# Patient Record
Sex: Female | Born: 1940 | Race: Black or African American | Hispanic: No | Marital: Married | Smoking: Never smoker
Health system: Southern US, Community
[De-identification: ages and names within clinical notes are randomized; demographics above are authoritative.]

---

## 2013-03-18 ENCOUNTER — Ambulatory Visit: Payer: Self-pay | Admitting: Oral Surgery

## 2013-05-07 ENCOUNTER — Emergency Department: Payer: Self-pay | Admitting: Emergency Medicine

## 2013-05-07 LAB — CBC
HGB: 11.4 g/dL — ABNORMAL LOW (ref 12.0–16.0)
MCHC: 31.8 g/dL — ABNORMAL LOW (ref 32.0–36.0)
MCV: 76 fL — ABNORMAL LOW (ref 80–100)
Platelet: 242 10*3/uL (ref 150–440)
RDW: 14.7 % — ABNORMAL HIGH (ref 11.5–14.5)
WBC: 6.6 10*3/uL (ref 3.6–11.0)

## 2013-05-07 LAB — BASIC METABOLIC PANEL
BUN: 24 mg/dL — ABNORMAL HIGH (ref 7–18)
Calcium, Total: 8.1 mg/dL — ABNORMAL LOW (ref 8.5–10.1)
Chloride: 98 mmol/L (ref 98–107)
EGFR (Non-African Amer.): 47 — ABNORMAL LOW
Osmolality: 278 (ref 275–301)
Potassium: 3.1 mmol/L — ABNORMAL LOW (ref 3.5–5.1)
Sodium: 137 mmol/L (ref 136–145)

## 2013-05-07 LAB — TROPONIN I: Troponin-I: <0.02 ng/mL

## 2013-07-22 ENCOUNTER — Encounter: Payer: Self-pay | Admitting: Internal Medicine

## 2013-07-27 LAB — HEMOGLOBIN A1C: Hemoglobin A1C: 6.9 % — ABNORMAL HIGH (ref 4.2–6.3)

## 2013-08-02 LAB — URINALYSIS, COMPLETE
BILIRUBIN, UR: NEGATIVE
Blood: NEGATIVE
Glucose,UR: NEGATIVE mg/dL (ref 0–75)
Hyaline Cast: 2
KETONE: NEGATIVE
NITRITE: NEGATIVE
Ph: 8 (ref 4.5–8.0)
Protein: NEGATIVE
SPECIFIC GRAVITY: 1.014 (ref 1.003–1.030)
Squamous Epithelial: NONE SEEN
WBC UR: 72 /HPF (ref 0–5)

## 2013-08-05 LAB — URINE CULTURE

## 2013-08-10 LAB — BASIC METABOLIC PANEL
ANION GAP: 5 — AB (ref 7–16)
BUN: 13 mg/dL (ref 7–18)
CHLORIDE: 109 mmol/L — AB (ref 98–107)
Calcium, Total: 8.5 mg/dL (ref 8.5–10.1)
Co2: 26 mmol/L (ref 21–32)
Creatinine: 0.88 mg/dL (ref 0.60–1.30)
EGFR (African American): >60
Glucose: 89 mg/dL (ref 65–99)
OSMOLALITY: 279 (ref 275–301)
Potassium: 3.7 mmol/L (ref 3.5–5.1)
Sodium: 140 mmol/L (ref 136–145)

## 2013-08-10 LAB — HEMOGLOBIN A1C: HEMOGLOBIN A1C: 6.9 % — AB (ref 4.2–6.3)

## 2013-08-22 ENCOUNTER — Emergency Department: Payer: Self-pay | Admitting: Emergency Medicine

## 2013-08-22 LAB — URINALYSIS, COMPLETE
Bilirubin,UR: NEGATIVE
Blood: NEGATIVE
GLUCOSE, UR: NEGATIVE mg/dL (ref 0–75)
Ketone: NEGATIVE
Leukocyte Esterase: NEGATIVE
Nitrite: NEGATIVE
PH: 7 (ref 4.5–8.0)
Protein: NEGATIVE
SPECIFIC GRAVITY: 1.01 (ref 1.003–1.030)
WBC UR: <1 /HPF (ref 0–5)

## 2013-08-22 LAB — COMPREHENSIVE METABOLIC PANEL
ALT: 21 U/L (ref 12–78)
Albumin: 2.8 g/dL — ABNORMAL LOW (ref 3.4–5.0)
Alkaline Phosphatase: 71 U/L
Anion Gap: 4 — ABNORMAL LOW (ref 7–16)
BILIRUBIN TOTAL: 0.3 mg/dL (ref 0.2–1.0)
BUN: 20 mg/dL — ABNORMAL HIGH (ref 7–18)
CALCIUM: 8.2 mg/dL — AB (ref 8.5–10.1)
CHLORIDE: 105 mmol/L (ref 98–107)
Co2: 31 mmol/L (ref 21–32)
Creatinine: 0.96 mg/dL (ref 0.60–1.30)
EGFR (Non-African Amer.): 60 — ABNORMAL LOW
GLUCOSE: 172 mg/dL — AB (ref 65–99)
OSMOLALITY: 286 (ref 275–301)
POTASSIUM: 4.6 mmol/L (ref 3.5–5.1)
SGOT(AST): 33 U/L (ref 15–37)
SODIUM: 140 mmol/L (ref 136–145)
Total Protein: 7.4 g/dL (ref 6.4–8.2)

## 2013-08-22 LAB — CBC
HCT: 33 % — ABNORMAL LOW (ref 35.0–47.0)
HGB: 10.4 g/dL — AB (ref 12.0–16.0)
MCH: 25.1 pg — ABNORMAL LOW (ref 26.0–34.0)
MCHC: 31.5 g/dL — AB (ref 32.0–36.0)
MCV: 80 fL (ref 80–100)
Platelet: 244 10*3/uL (ref 150–440)
RBC: 4.13 10*6/uL (ref 3.80–5.20)
RDW: 15.5 % — AB (ref 11.5–14.5)
WBC: 9 10*3/uL (ref 3.6–11.0)

## 2013-10-09 ENCOUNTER — Emergency Department: Payer: Self-pay | Admitting: Emergency Medicine

## 2014-02-11 ENCOUNTER — Ambulatory Visit: Payer: Self-pay | Admitting: Family Medicine

## 2015-03-31 IMAGING — CR DG ANKLE COMPLETE 3+V*L*
2 series · 4 of 4 positions shown · non-contrast
Comparison: None.

CLINICAL DATA: Status post fall.  Ankle pain.

EXAM:
LEFT ANKLE COMPLETE - 3+ VIEW

[kdxr ankle left complete (1 of 2)]
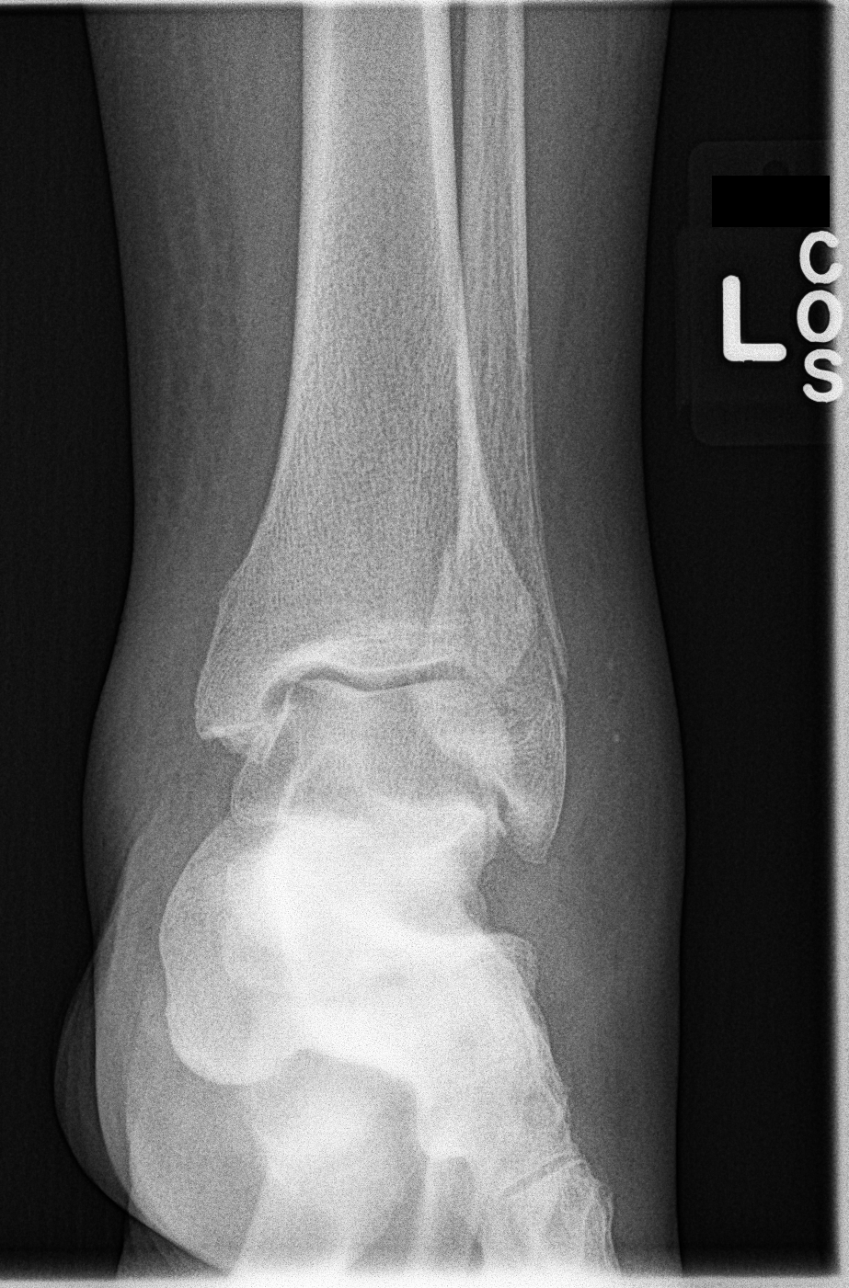

[Series 1: kdxr ankle left complete · 0.14mm/px · 3 of 3 slices shown (2 of 2)]
[im 1/3]
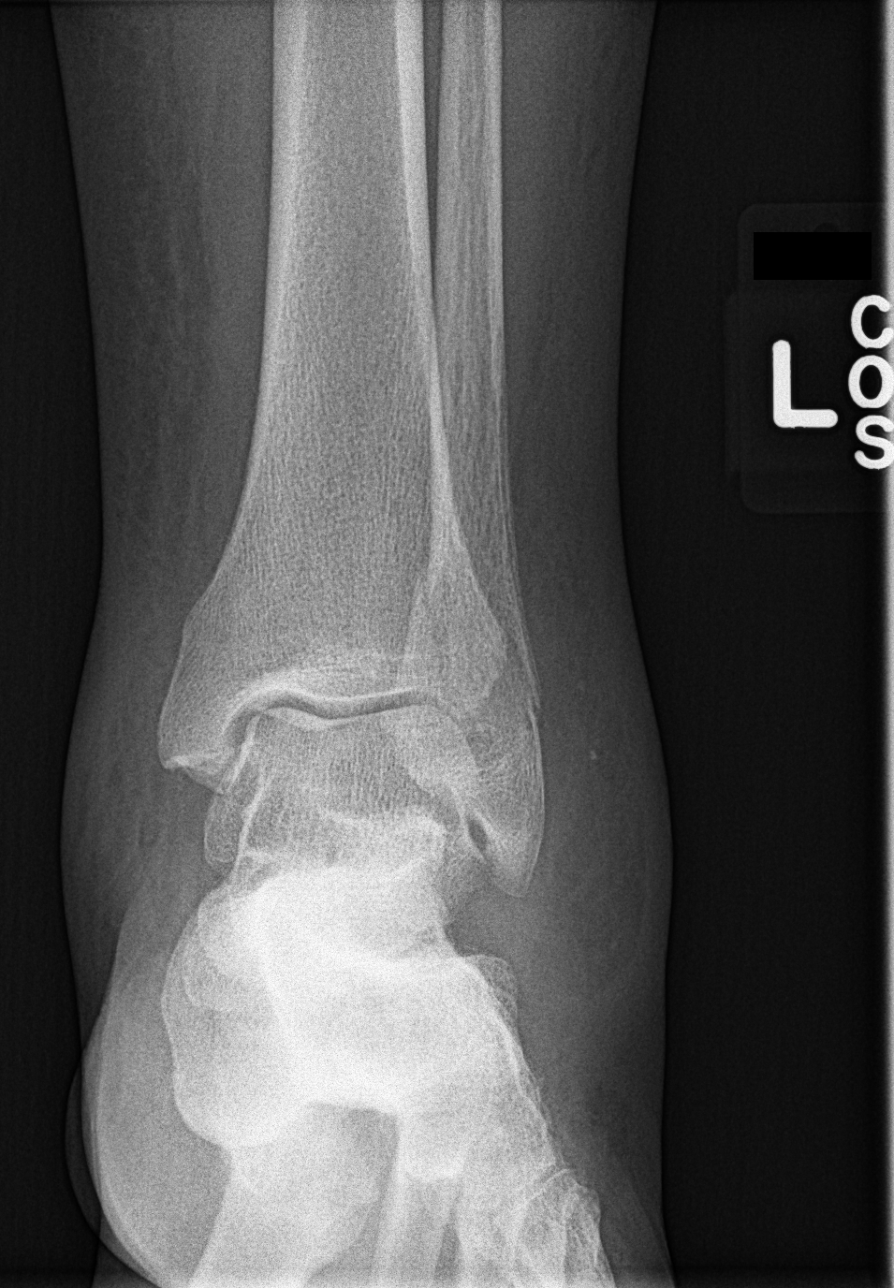
[im 2/3]
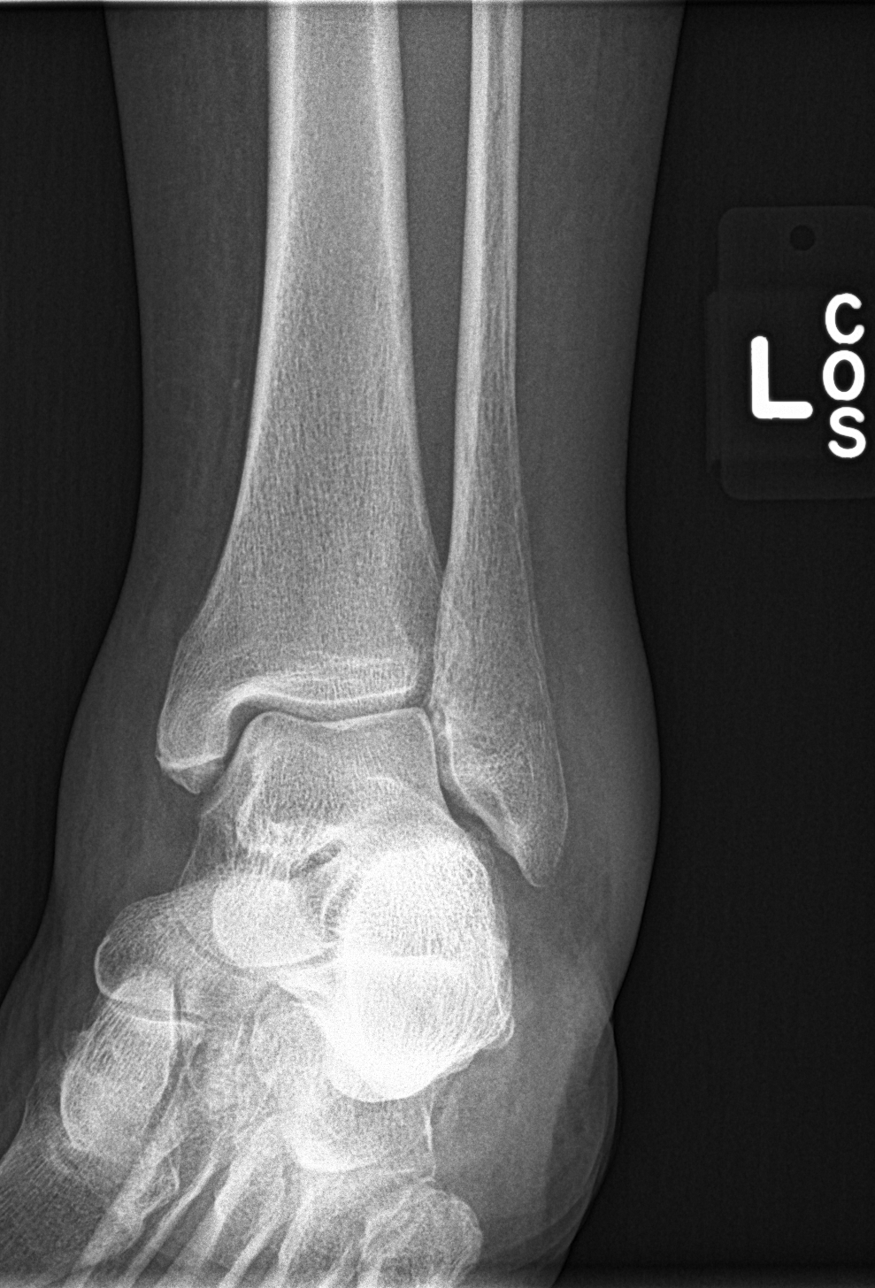
[im 3/3]
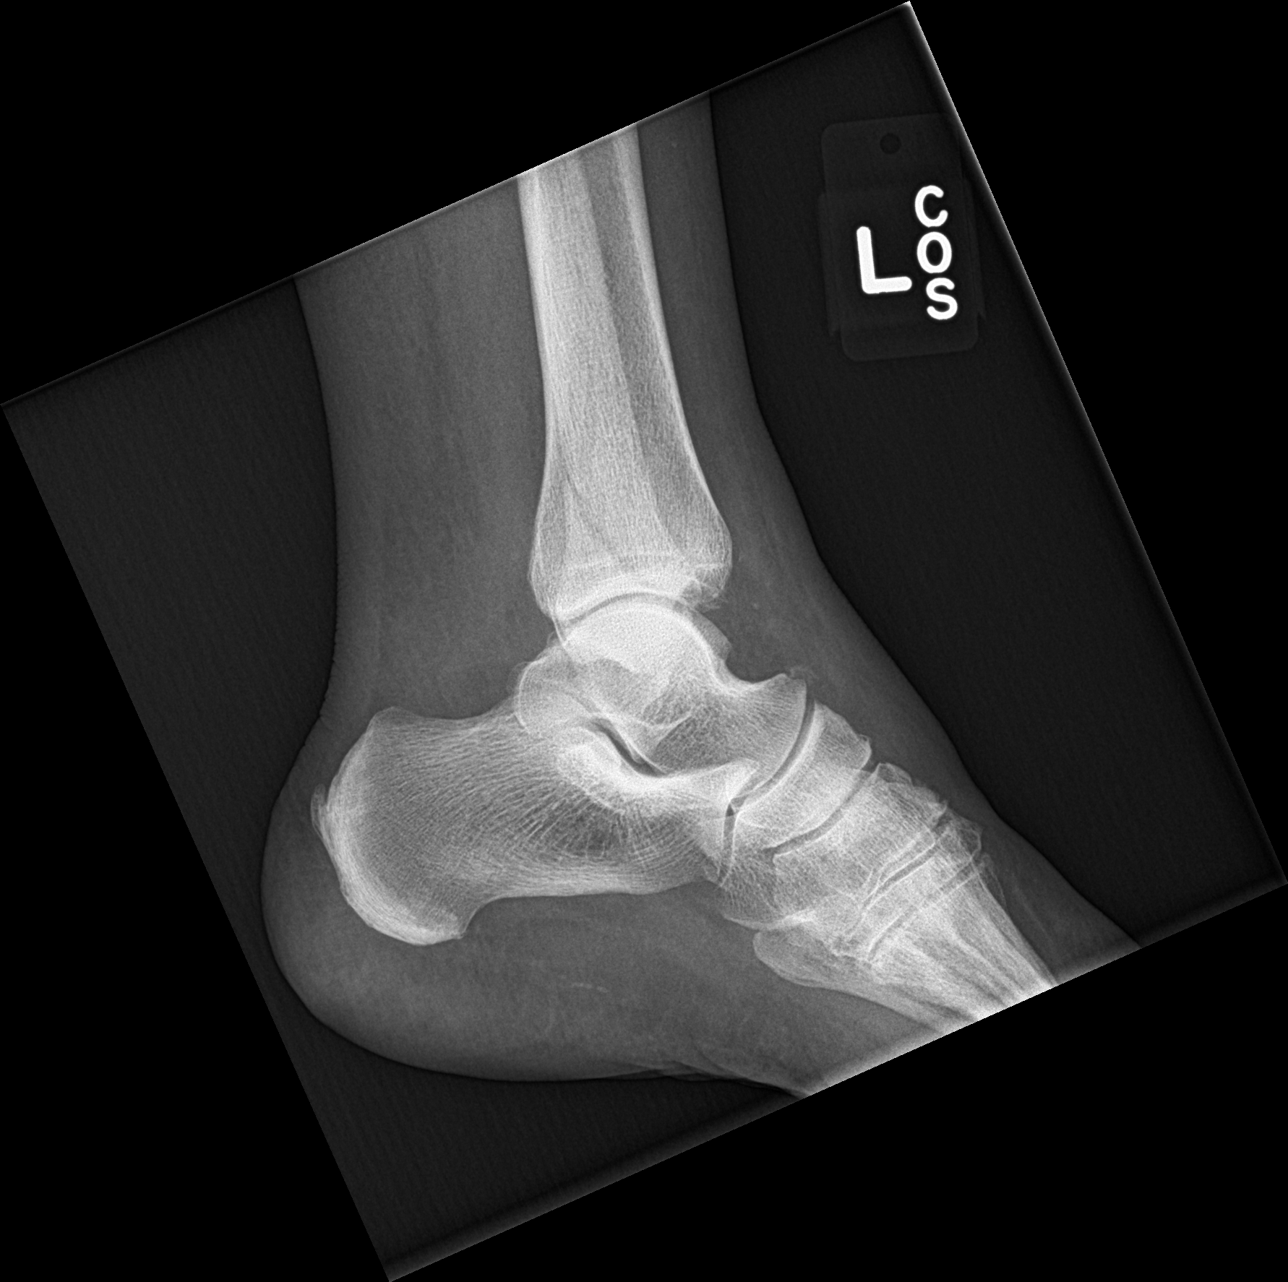

[4 of 4 positions shown; findings below may reference images not displayed]

FINDINGS: The patient has an oblique nondisplaced fracture of the distal
fibula. Soft tissue swelling is present about the lateral ankle. No
other acute bony abnormality is identified. Soft tissues about the
ankle for swollen.
IMPRESSION: Oblique, nondisplaced lateral malleolar fracture.

## 2015-07-24 ENCOUNTER — Other Ambulatory Visit: Payer: Self-pay | Admitting: Family Medicine

## 2015-08-22 ENCOUNTER — Encounter: Payer: Self-pay | Admitting: *Deleted

## 2015-08-22 DIAGNOSIS — R109 Unspecified abdominal pain: Secondary | ICD-10-CM | POA: Diagnosis not present

## 2015-08-22 DIAGNOSIS — Z5321 Procedure and treatment not carried out due to patient leaving prior to being seen by health care provider: Secondary | ICD-10-CM | POA: Diagnosis not present

## 2015-08-22 LAB — CBC
HCT: 36.6 % (ref 35.0–47.0)
Hemoglobin: 11.7 g/dL — ABNORMAL LOW (ref 12.0–16.0)
MCH: 24.4 pg — ABNORMAL LOW (ref 26.0–34.0)
MCHC: 32 g/dL (ref 32.0–36.0)
MCV: 76.2 fL — AB (ref 80.0–100.0)
PLATELETS: 222 10*3/uL (ref 150–440)
RBC: 4.8 MIL/uL (ref 3.80–5.20)
RDW: 15.8 % — AB (ref 11.5–14.5)
WBC: 6.2 10*3/uL (ref 3.6–11.0)

## 2015-08-22 LAB — BASIC METABOLIC PANEL
ANION GAP: 7 (ref 5–15)
BUN: 16 mg/dL (ref 6–20)
CALCIUM: 9.6 mg/dL (ref 8.9–10.3)
CO2: 34 mmol/L — AB (ref 22–32)
Chloride: 94 mmol/L — ABNORMAL LOW (ref 101–111)
Creatinine, Ser: 0.9 mg/dL (ref 0.44–1.00)
GFR calc Af Amer: >60 mL/min (ref >60)
GFR calc non Af Amer: >60 mL/min (ref >60)
GLUCOSE: 264 mg/dL — AB (ref 65–99)
Potassium: 3.1 mmol/L — ABNORMAL LOW (ref 3.5–5.1)
Sodium: 135 mmol/L (ref 135–145)

## 2015-08-22 LAB — URINALYSIS COMPLETE WITH MICROSCOPIC (ARMC ONLY)
Bilirubin Urine: NEGATIVE
GLUCOSE, UA: NEGATIVE mg/dL
HGB URINE DIPSTICK: NEGATIVE
Ketones, ur: NEGATIVE mg/dL
Nitrite: NEGATIVE
PH: 6 (ref 5.0–8.0)
Protein, ur: NEGATIVE mg/dL
Specific Gravity, Urine: 1.012 (ref 1.005–1.030)

## 2015-08-22 NOTE — ED Notes (Addendum)
Pt to triage via wheelchair.  Pt reports right side flank pain for 2 weeks.  Pain is intermittent.  Pt taking pain meds without relief.  No n/v/d.  No hx of kidney stones.  No known injury to back . No chest pain.  No sob.   Pt alert.

## 2015-08-23 ENCOUNTER — Emergency Department
Admission: EM | Admit: 2015-08-23 | Discharge: 2015-08-23 | Disposition: A | Discharge status: Home / Self Care | Payer: 59 | Attending: Emergency Medicine | Admitting: Emergency Medicine

## 2015-11-30 ENCOUNTER — Other Ambulatory Visit: Payer: Self-pay | Admitting: Family Medicine

## 2015-11-30 DIAGNOSIS — R600 Localized edema: Secondary | ICD-10-CM

## 2015-12-04 ENCOUNTER — Ambulatory Visit
Admission: RE | Admit: 2015-12-04 | Discharge: 2015-12-04 | Disposition: A | Discharge status: Home / Self Care | Payer: 59 | Source: Ambulatory Visit | Attending: Family Medicine | Admitting: Family Medicine

## 2015-12-04 ENCOUNTER — Ambulatory Visit: Admission: RE | Admit: 2015-12-04 | Payer: Medicare PPO | Source: Ambulatory Visit

## 2015-12-04 DIAGNOSIS — R600 Localized edema: Secondary | ICD-10-CM | POA: Insufficient documentation

## 2018-02-01 IMAGING — US US EXTREM LOW VENOUS BILAT
1 series · 14 of 24 positions shown · non-contrast
Comparison: None.

CLINICAL DATA: Bilateral lower extremity edema right greater than
left for 2 month

EXAM:
BILATERAL LOWER EXTREMITY VENOUS DUPLEX ULTRASOUND
TECHNIQUE: Doppler venous assessment of the left lower extremity deep venous
system was performed, including characterization of spectral flow,
compressibility, and phasicity.

[Series 1: us extrem low venous bilat · 0.08mm/px · 14 of 62 slices shown]
[im 1/62]
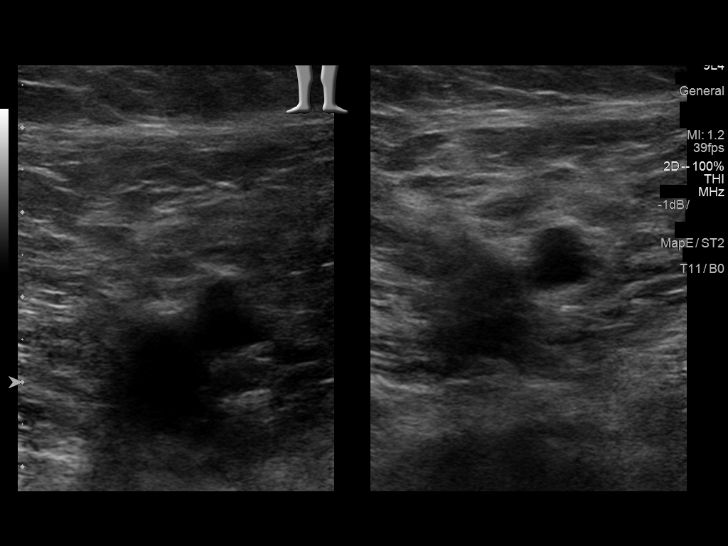
[im 6/62]
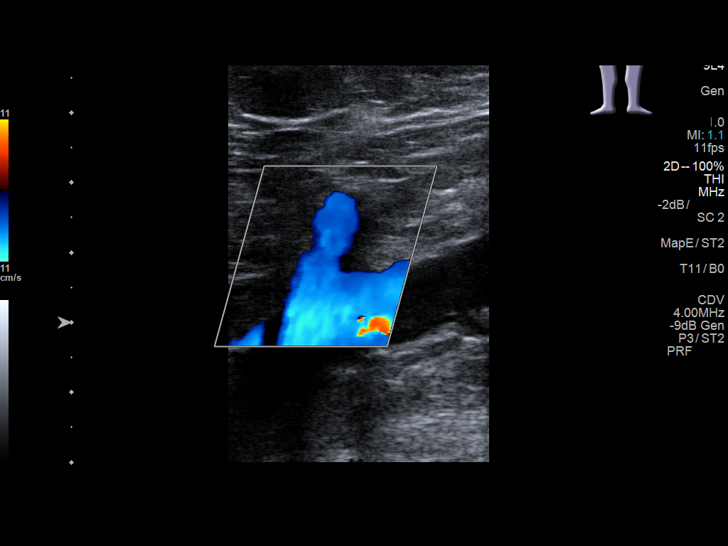
[im 11/62]
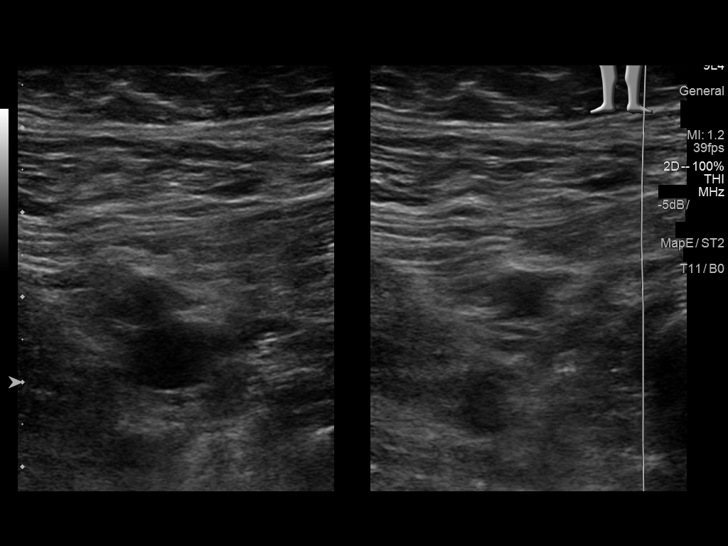
[im 16/62]
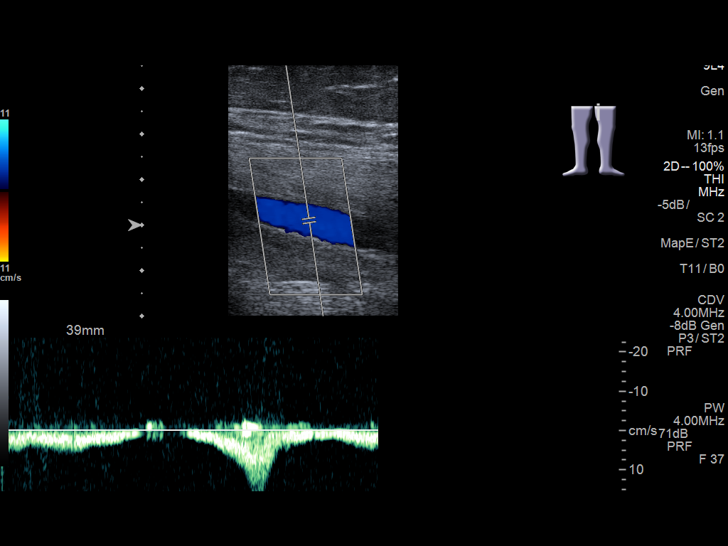
[im 19/62]
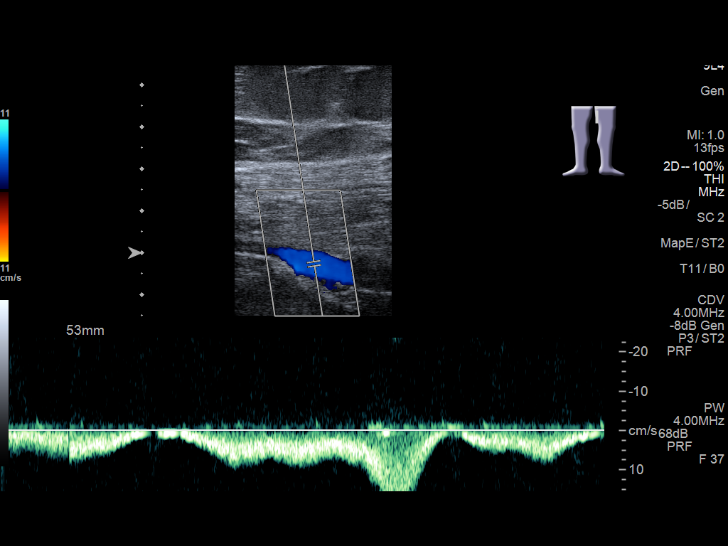
[im 24/62]
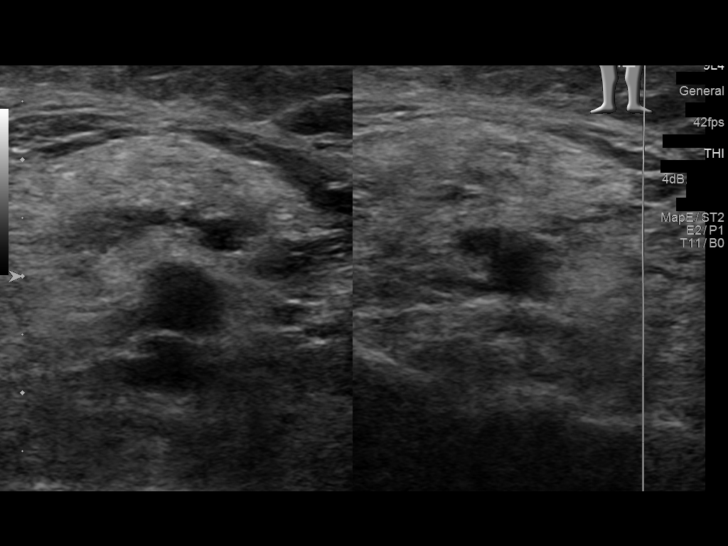
[im 30/62]
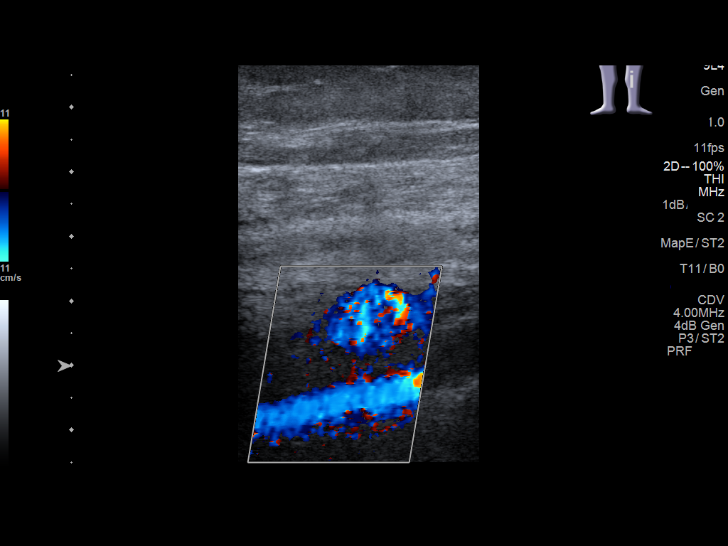
[im 32/62]
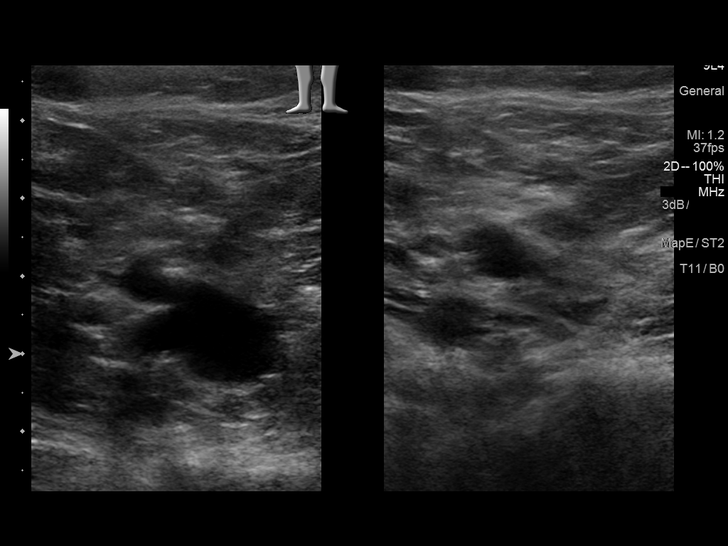
[im 38/62]
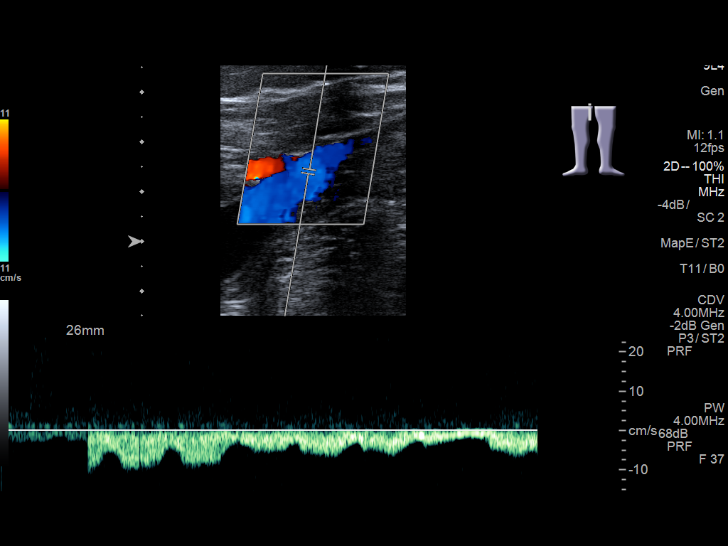
[im 43/62]
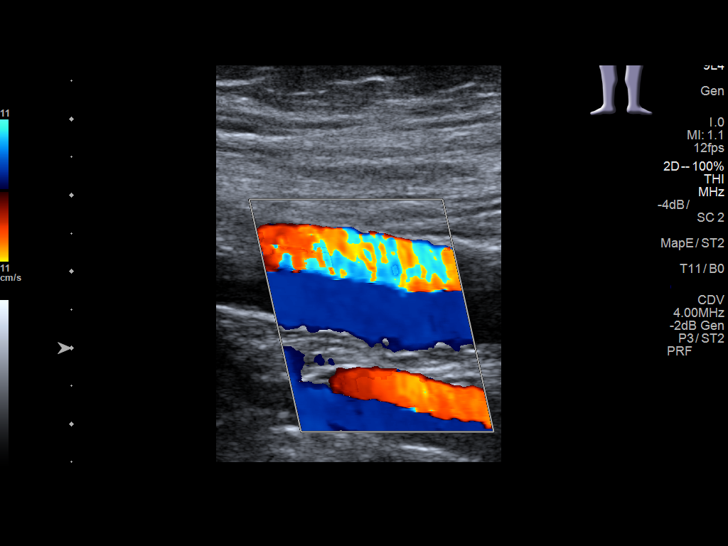
[im 48/62]
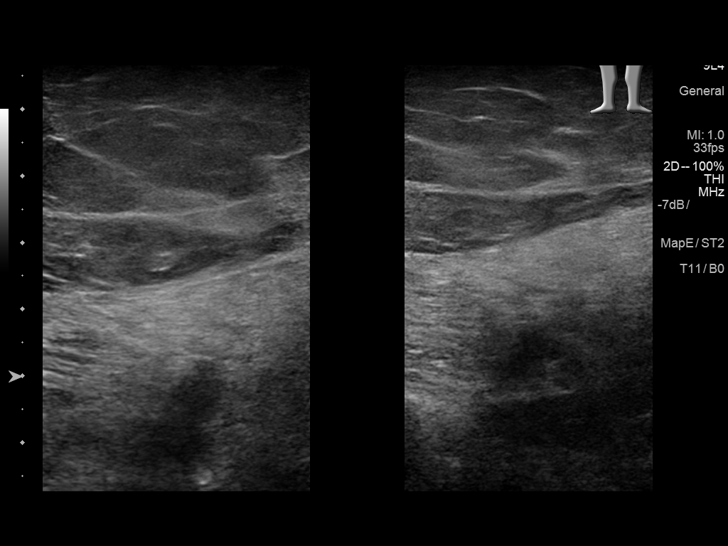
[im 51/62]
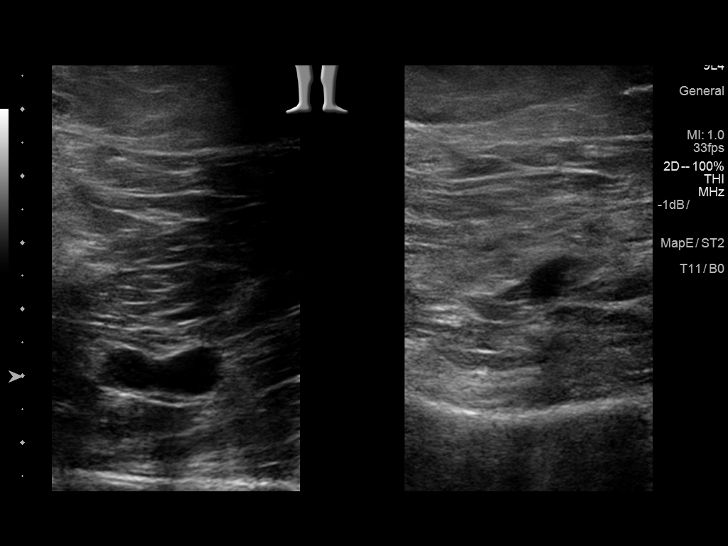
[im 56/62]
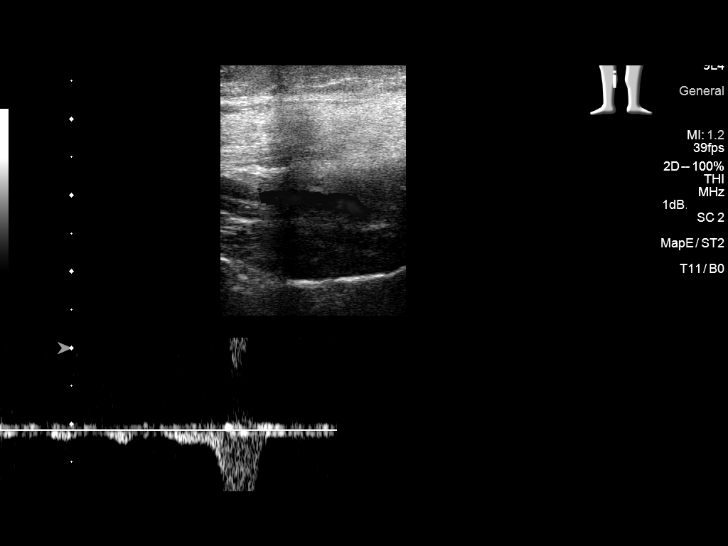
[im 62/62]
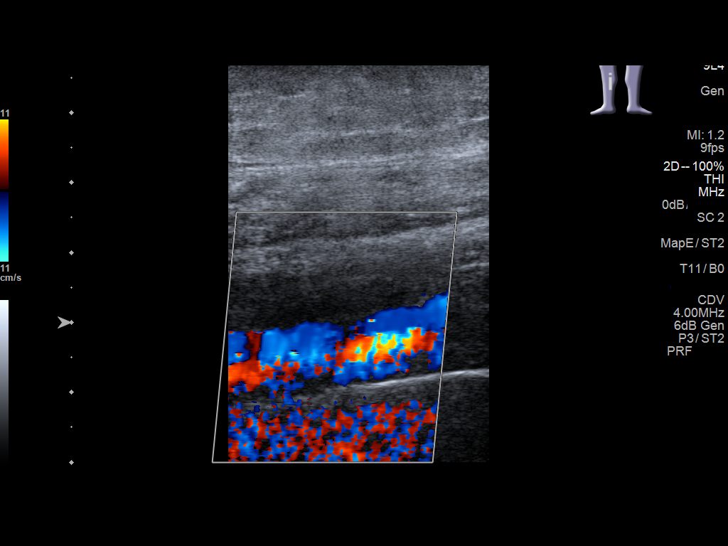

[14 of 24 positions shown; findings below may reference images not displayed]

FINDINGS: There is complete compressibility of the bilateral common femoral,
femoral, and popliteal veins. Doppler analysis demonstrates
respiratory phasicity and augmentation of flow with calf
compression. No obvious superficial vein or calf vein thrombosis.
IMPRESSION: No evidence of DVT.

## 2020-07-18 DEATH — deceased
